# Patient Record
Sex: Male | Born: 1985 | Race: White | Hispanic: Yes | Marital: Single | Smoking: Never smoker
Health system: Southern US, Community
[De-identification: ages and names within clinical notes are randomized; demographics above are authoritative.]

## PROBLEM LIST (undated history)

## (undated) ENCOUNTER — Ambulatory Visit (HOSPITAL_COMMUNITY): Payer: Self-pay

---

## 2019-09-13 ENCOUNTER — Other Ambulatory Visit: Payer: Self-pay

## 2019-09-13 ENCOUNTER — Emergency Department (HOSPITAL_COMMUNITY)
Admission: EM | Admit: 2019-09-13 | Discharge: 2019-09-14 | Disposition: A | Discharge status: Home / Self Care | Payer: No Typology Code available for payment source | Attending: Emergency Medicine | Admitting: Emergency Medicine

## 2019-09-13 ENCOUNTER — Encounter (HOSPITAL_COMMUNITY): Payer: Self-pay

## 2019-09-13 DIAGNOSIS — S5012XA Contusion of left forearm, initial encounter: Secondary | ICD-10-CM | POA: Insufficient documentation

## 2019-09-13 DIAGNOSIS — R0781 Pleurodynia: Secondary | ICD-10-CM

## 2019-09-13 DIAGNOSIS — T07XXXA Unspecified multiple injuries, initial encounter: Secondary | ICD-10-CM | POA: Diagnosis present

## 2019-09-13 DIAGNOSIS — Y939 Activity, unspecified: Secondary | ICD-10-CM | POA: Insufficient documentation

## 2019-09-13 DIAGNOSIS — Y929 Unspecified place or not applicable: Secondary | ICD-10-CM | POA: Diagnosis not present

## 2019-09-13 DIAGNOSIS — S50812A Abrasion of left forearm, initial encounter: Secondary | ICD-10-CM | POA: Diagnosis not present

## 2019-09-13 DIAGNOSIS — M79602 Pain in left arm: Secondary | ICD-10-CM

## 2019-09-13 DIAGNOSIS — M542 Cervicalgia: Secondary | ICD-10-CM | POA: Insufficient documentation

## 2019-09-13 DIAGNOSIS — R0789 Other chest pain: Secondary | ICD-10-CM | POA: Insufficient documentation

## 2019-09-13 DIAGNOSIS — M545 Low back pain: Secondary | ICD-10-CM | POA: Diagnosis not present

## 2019-09-13 DIAGNOSIS — Y999 Unspecified external cause status: Secondary | ICD-10-CM | POA: Diagnosis not present
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
## 2019-09-14 ENCOUNTER — Emergency Department (HOSPITAL_COMMUNITY): Payer: No Typology Code available for payment source

## 2019-09-14 MED ORDER — NAPROXEN 500 MG PO TABS
500.0000 mg | ORAL_TABLET | Freq: Two times a day (BID) | ORAL | 0 refills | Status: AC
Start: 2019-09-14 — End: ?

## 2019-09-14 MED ORDER — OXYCODONE-ACETAMINOPHEN 5-325 MG PO TABS
2.0000 | ORAL_TABLET | Freq: Once | ORAL | Status: AC
  Administered 2019-09-14: 2 via ORAL
  Filled 2019-09-14: qty 2

## 2019-09-14 MED ORDER — METHOCARBAMOL 500 MG PO TABS
500.0000 mg | ORAL_TABLET | Freq: Two times a day (BID) | ORAL | 0 refills | Status: DC
Start: 2019-09-14 — End: 2021-01-26
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
## 2021-01-26 ENCOUNTER — Emergency Department
Admission: EM | Admit: 2021-01-26 | Discharge: 2021-01-26 | Disposition: A | Discharge status: Home / Self Care | Payer: Self-pay | Attending: Emergency Medicine | Admitting: Emergency Medicine

## 2021-01-26 ENCOUNTER — Emergency Department: Payer: Self-pay

## 2021-01-26 ENCOUNTER — Other Ambulatory Visit: Payer: Self-pay

## 2021-01-26 DIAGNOSIS — R110 Nausea: Secondary | ICD-10-CM | POA: Insufficient documentation

## 2021-01-26 DIAGNOSIS — R519 Headache, unspecified: Secondary | ICD-10-CM | POA: Insufficient documentation

## 2021-01-26 DIAGNOSIS — R002 Palpitations: Secondary | ICD-10-CM | POA: Insufficient documentation

## 2021-01-26 DIAGNOSIS — I10 Essential (primary) hypertension: Secondary | ICD-10-CM | POA: Insufficient documentation

## 2021-01-26 DIAGNOSIS — H539 Unspecified visual disturbance: Secondary | ICD-10-CM | POA: Insufficient documentation

## 2021-01-26 DIAGNOSIS — R079 Chest pain, unspecified: Secondary | ICD-10-CM

## 2021-01-26 DIAGNOSIS — R202 Paresthesia of skin: Secondary | ICD-10-CM | POA: Insufficient documentation

## 2021-01-26 DIAGNOSIS — R0789 Other chest pain: Secondary | ICD-10-CM | POA: Insufficient documentation

## 2021-01-26 LAB — CBC
HCT: 47.4 % (ref 39.0–52.0)
Hemoglobin: 16.5 g/dL (ref 13.0–17.0)
MCH: 30.8 pg (ref 26.0–34.0)
MCHC: 34.8 g/dL (ref 30.0–36.0)
MCV: 88.4 fL (ref 80.0–100.0)
Platelets: 286 10*3/uL (ref 150–400)
RBC: 5.36 MIL/uL (ref 4.22–5.81)
RDW: 11.9 % (ref 11.5–15.5)
WBC: 7.5 10*3/uL (ref 4.0–10.5)
nRBC: 0 % (ref 0.0–0.2)

## 2021-01-26 LAB — BASIC METABOLIC PANEL
Anion gap: 8 (ref 5–15)
BUN: 18 mg/dL (ref 6–20)
CO2: 25 mmol/L (ref 22–32)
Calcium: 8.8 mg/dL — ABNORMAL LOW (ref 8.9–10.3)
Chloride: 102 mmol/L (ref 98–111)
Creatinine, Ser: 0.85 mg/dL (ref 0.61–1.24)
GFR, Estimated: >60 mL/min (ref >60)
Glucose, Bld: 111 mg/dL — ABNORMAL HIGH (ref 70–99)
Potassium: 4.3 mmol/L (ref 3.5–5.1)
Sodium: 135 mmol/L (ref 135–145)

## 2021-01-26 LAB — TROPONIN I (HIGH SENSITIVITY)
Troponin I (High Sensitivity): 3 ng/L (ref <18)
Troponin I (High Sensitivity): 2 ng/L (ref <18)

## 2021-01-26 MED ORDER — ACETAMINOPHEN 325 MG PO TABS
650.0000 mg | ORAL_TABLET | Freq: Once | ORAL | Status: AC
  Administered 2021-01-26: 650 mg via ORAL
  Filled 2021-01-26: qty 2

## 2021-01-26 MED ORDER — CYCLOBENZAPRINE HCL 5 MG PO TABS: 5.0000 mg | ORAL_TABLET | Freq: Every day | ORAL | 0 refills | Status: AC

## 2021-01-26 MED ORDER — METOCLOPRAMIDE HCL 5 MG PO TABS: 5.0000 mg | ORAL_TABLET | Freq: Three times a day (TID) | ORAL | 0 refills | Status: AC | PRN

## 2021-01-26 MED ORDER — METOCLOPRAMIDE HCL 10 MG PO TABS
10.0000 mg | ORAL_TABLET | Freq: Once | ORAL | Status: AC
  Administered 2021-01-26: 10 mg via ORAL
  Filled 2021-01-26: qty 1

## 2021-01-26 NOTE — Discharge Instructions (Addendum)
Your exam, EKG, CT, and labs are all normal and reassuring at this time.  No sign of a heart attack or serious head injury.  You should take the prescription blood pressure medicine as prescribed.  Take the muscle relaxant and nausea medicine a needed for headaches. Follow-up with your cardiologist for continued care.
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
## 2023-04-19 IMAGING — CT CT HEAD W/O CM
4 series · 16 of 47 positions shown, 18 images · non-contrast
Comparison: None.

CLINICAL DATA: Right arm paresthesias.

EXAM:
CT HEAD WITHOUT CONTRAST
TECHNIQUE: Contiguous axial images were obtained from the base of the skull
through the vertex without intravenous contrast.

[Series 2: head wo · axial · 0.41mm/px · z∈[+429,+534]mm · 7 of 29 slices shown, 9 images]
[im 4/29  brain]
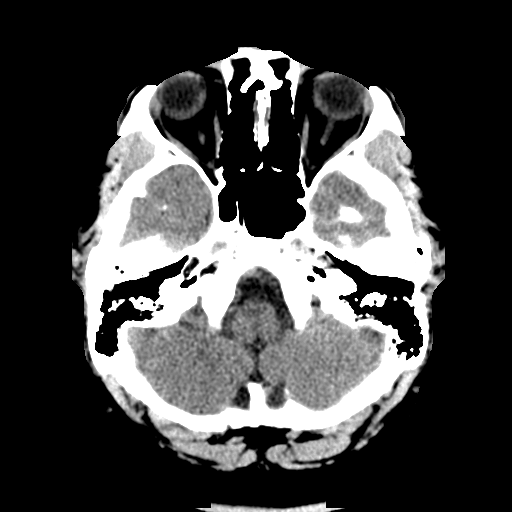
[im 4/29  bone]
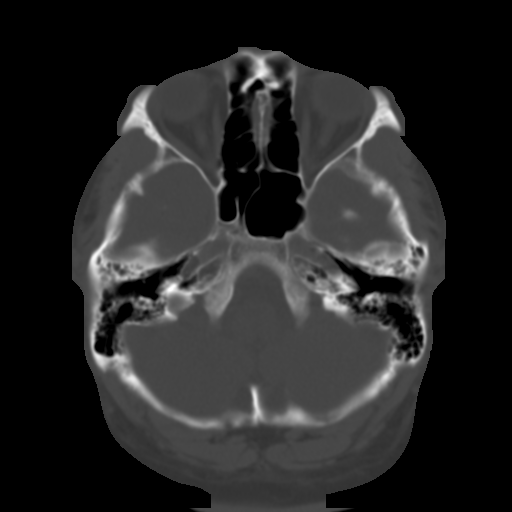
[im 8/29  brain]
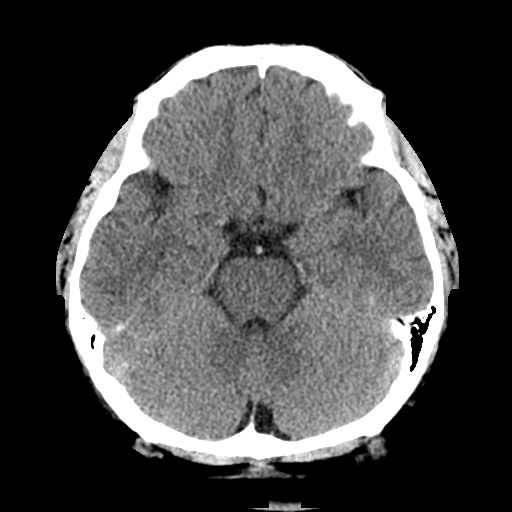
[im 11/29  brain]
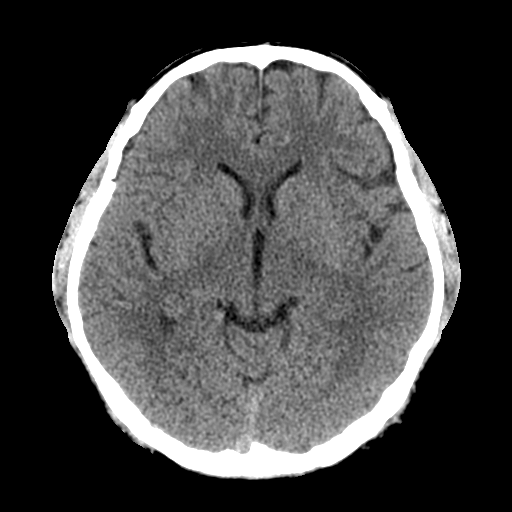
[im 15/29  brain]
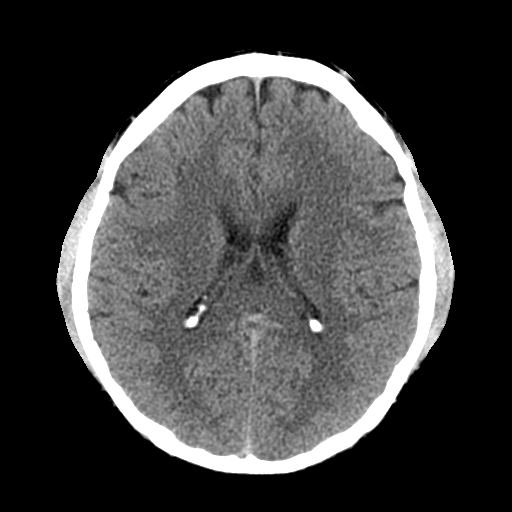
[im 18/29  brain]
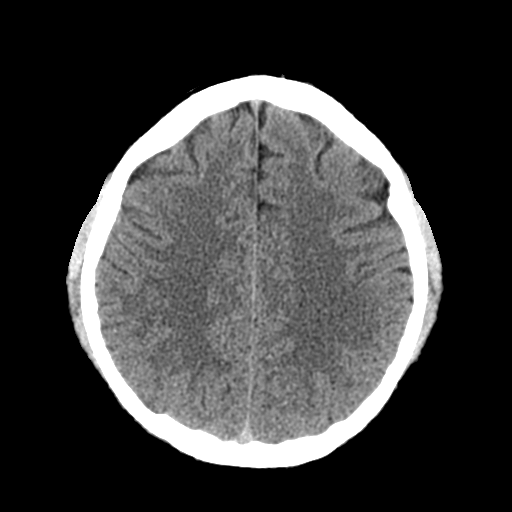
[im 18/29  bone]
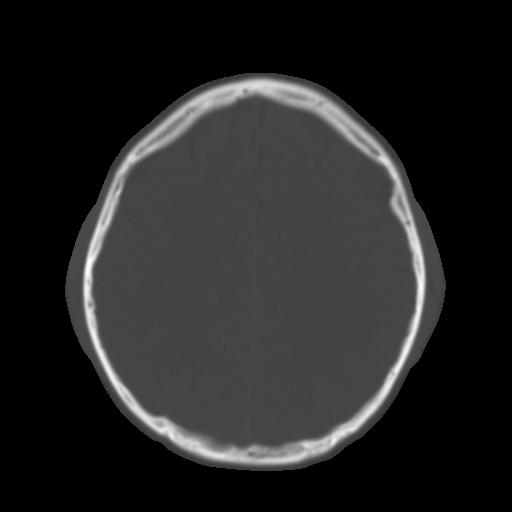
[im 22/29  brain]
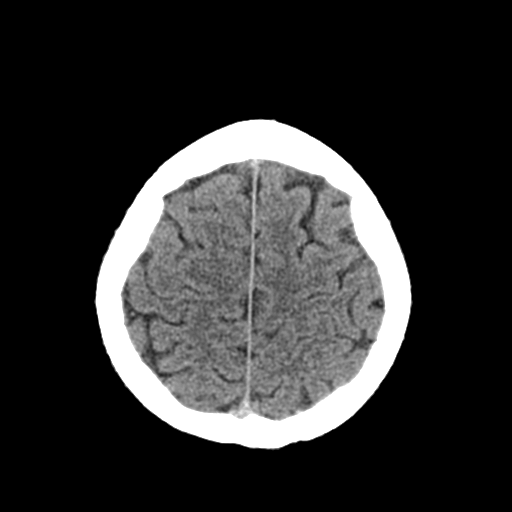
[im 25/29  brain]
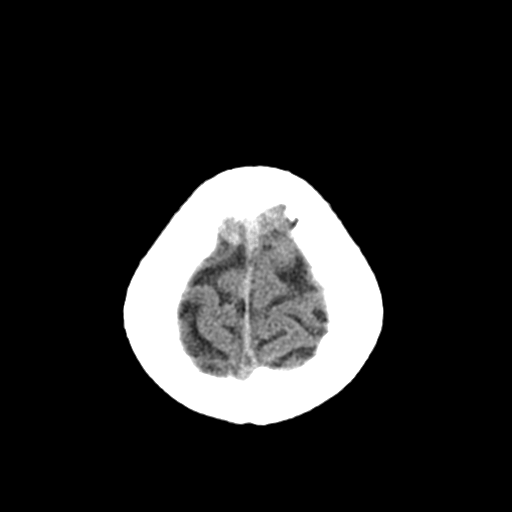

[Series 3: head bone · axial · 0.41mm/px · z∈[+428,+456]mm · 3 of 72 slices shown]
[im 8/72  bone]
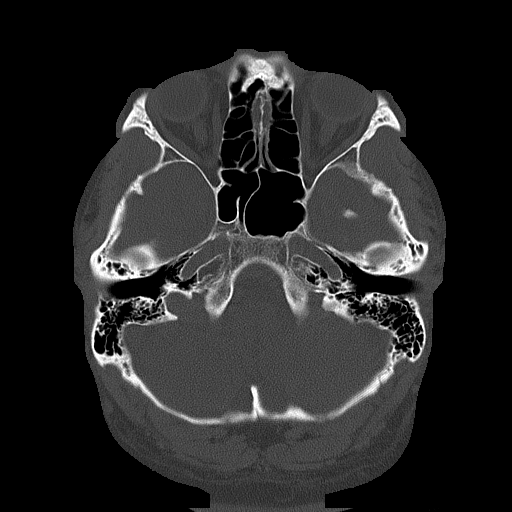
[im 15/72  bone]
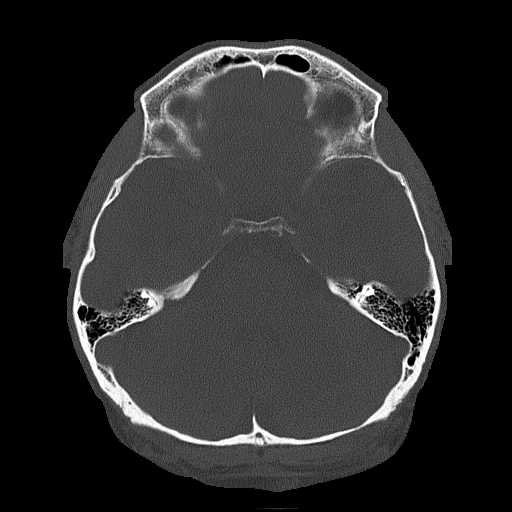
[im 22/72  bone]
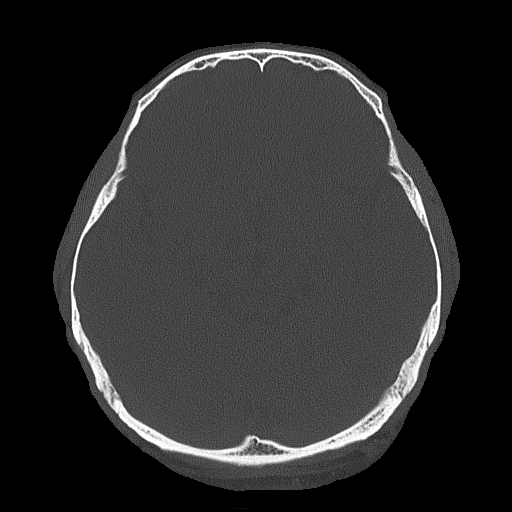

[Series 4: coronal soft tissue · coronal · 0.33mm/px · 3 of 63 slices shown]
[im 21/63  brain]
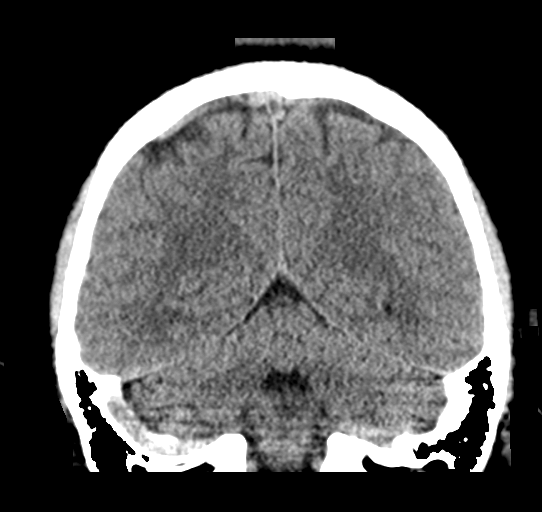
[im 28/63  brain]
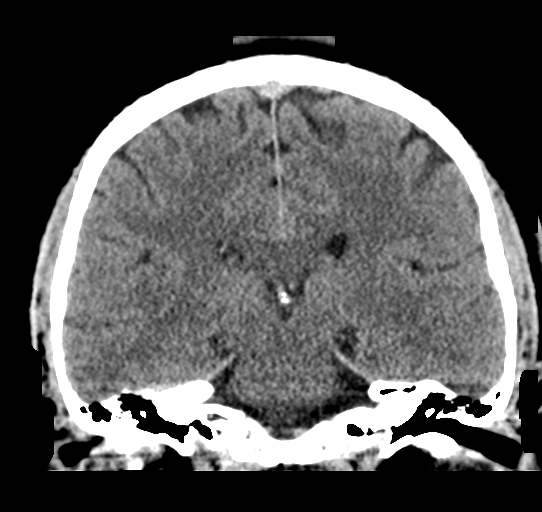
[im 35/63  brain]
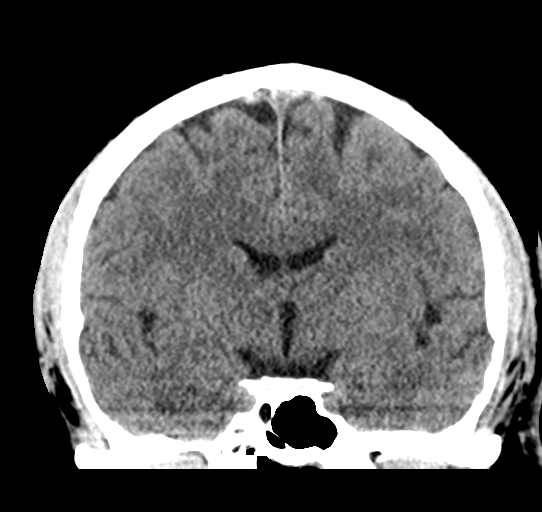

[Series 5: sagittal soft tissue · sagittal · 0.30mm/px · 3 of 61 slices shown]
[im 21/61  brain]
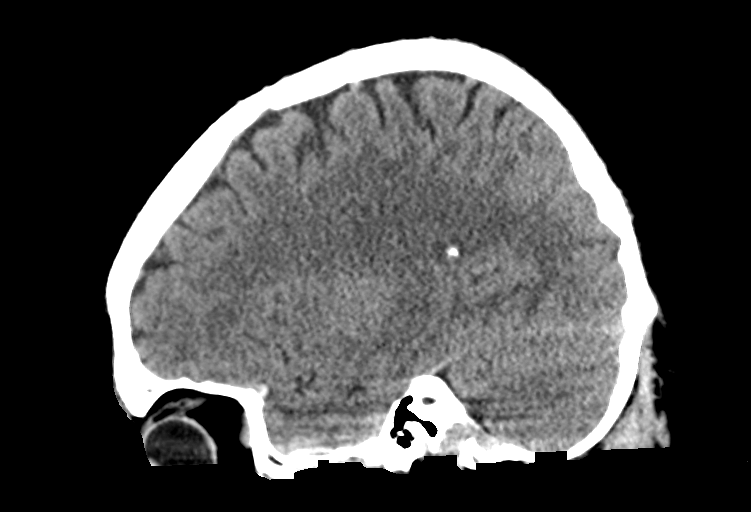
[im 31/61  brain]
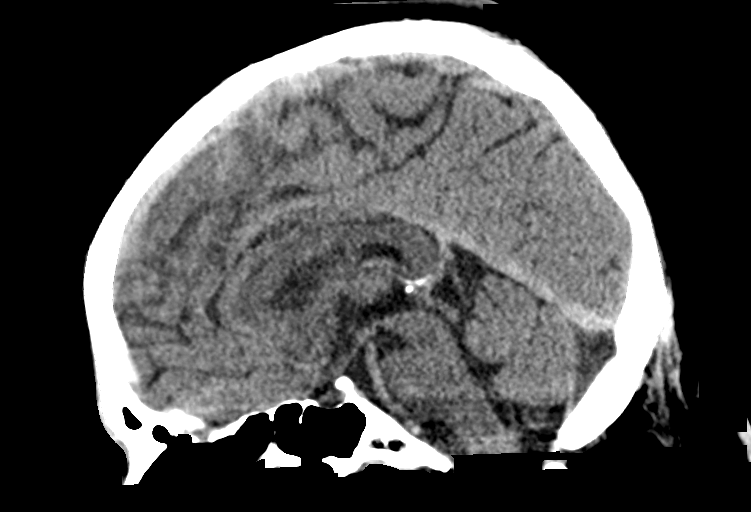
[im 41/61  brain]
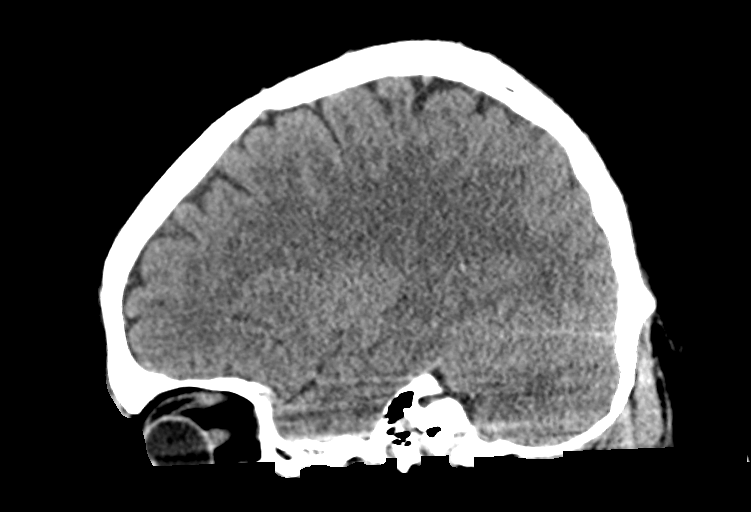

[16 of 47 positions shown; findings below may reference images not displayed]

FINDINGS: Brain: No evidence of acute infarction, hemorrhage, hydrocephalus,
extra-axial collection or mass lesion/mass effect.

Vascular: No hyperdense vessel or unexpected calcification.

Skull: Normal. Negative for fracture or focal lesion.

Sinuses/Orbits: No acute finding.

Other: None.
IMPRESSION: 1. Normal noncontrast head CT.
